# Patient Record
Sex: Female | Born: 2000 | Race: Black or African American | Hispanic: No | Smoking: Never smoker
Health system: Southern US, Community
[De-identification: ages and names within clinical notes are randomized; demographics above are authoritative.]

## PROBLEM LIST (undated history)

## (undated) DIAGNOSIS — Z87440 Personal history of urinary (tract) infections: Secondary | ICD-10-CM

## (undated) DIAGNOSIS — Z789 Other specified health status: Secondary | ICD-10-CM

## (undated) DIAGNOSIS — Z87898 Personal history of other specified conditions: Secondary | ICD-10-CM

## (undated) HISTORY — DX: Other specified health status: Z78.9

## (undated) HISTORY — DX: Personal history of other specified conditions: Z87.898

## (undated) HISTORY — DX: Personal history of urinary (tract) infections: Z87.440

## (undated) HISTORY — PX: OTHER SURGICAL HISTORY: SHX169

---
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
## 2018-08-10 LAB — OB RESULTS CONSOLE HIV ANTIBODY (ROUTINE TESTING): HIV: NONREACTIVE

## 2018-08-11 DIAGNOSIS — Z315 Encounter for genetic counseling: Secondary | ICD-10-CM | POA: Insufficient documentation

## 2018-08-11 LAB — SICKLE CELL SCREEN

## 2018-08-12 LAB — OB RESULTS CONSOLE GC/CHLAMYDIA
Chlamydia: NEGATIVE
Gonorrhea: NEGATIVE

## 2018-08-12 LAB — OB RESULTS CONSOLE ABO/RH
ABO/RH(D): O POS
RH Type: POSITIVE

## 2018-08-12 LAB — OB RESULTS CONSOLE ANTIBODY SCREEN: Antibody Screen: NEGATIVE

## 2018-08-12 LAB — OB RESULTS CONSOLE RPR: RPR: REACTIVE

## 2018-08-12 LAB — OB RESULTS CONSOLE HGB/HCT, BLOOD
HCT: 35 (ref 29–41)
Hemoglobin: 11.9

## 2018-08-12 LAB — OB RESULTS CONSOLE VARICELLA ZOSTER ANTIBODY, IGG
Varicella: IMMUNE
Varicella: IMMUNE

## 2018-08-12 LAB — OB RESULTS CONSOLE RUBELLA ANTIBODY, IGM: Rubella: IMMUNE

## 2018-08-12 LAB — OB RESULTS CONSOLE HEPATITIS B SURFACE ANTIGEN: Hepatitis B Surface Ag: NEGATIVE

## 2018-08-12 LAB — OB RESULTS CONSOLE PLATELET COUNT: Platelets: 283000

## 2018-09-06 DIAGNOSIS — O2341 Unspecified infection of urinary tract in pregnancy, first trimester: Secondary | ICD-10-CM | POA: Insufficient documentation

## 2018-09-06 DIAGNOSIS — Z6740 Type O blood, Rh positive: Secondary | ICD-10-CM | POA: Insufficient documentation

## 2018-09-06 DIAGNOSIS — Z3400 Encounter for supervision of normal first pregnancy, unspecified trimester: Secondary | ICD-10-CM | POA: Insufficient documentation

## 2018-09-07 ENCOUNTER — Other Ambulatory Visit: Payer: Self-pay

## 2018-09-07 ENCOUNTER — Ambulatory Visit: Payer: Medicaid Other | Admitting: Nurse Practitioner

## 2018-09-07 ENCOUNTER — Telehealth: Payer: Self-pay

## 2018-09-07 DIAGNOSIS — Z3402 Encounter for supervision of normal first pregnancy, second trimester: Secondary | ICD-10-CM

## 2018-09-07 NOTE — Telephone Encounter (Signed)
Attempted to call for [facility] Telehealth visit; no answer x 2, left voicemail message;per female @ home #, client at work; left appt # to call for rescheduled appt.Debera Lat, RN

## 2018-09-07 NOTE — Progress Notes (Signed)
   TELEPHONE OBSTETRICS VISIT ENCOUNTER NOTE  I connected with Jessica Love on 09/07/18 at  4:00 PM EDT by telephone at home and verified that I am speaking with the correct person using two identifiers.   I discussed the limitations, risks, security and privacy concerns of performing an evaluation and management service by telephone and the availability of in person appointments. I also discussed with the patient that there may be a patient responsible charge related to this service. The patient expressed understanding and agreed to proceed.  Subjective:  Jessica Love is a 18 y.o. G1P0 at [redacted]w[redacted]d being followed for ongoing prenatal care.  She is currently monitored for the following issues for this low-risk pregnancy and has Supervision of normal first pregnancy; Infection of urinary tract in pregnancy in first trimester; and Type O blood, Rh positive on their problem list.  Patient reports no complaints. Reports fetal movement. Denies any contractions, bleeding or leaking of fluid.   The following portions of the patient's history were reviewed and updated as appropriate: allergies, current medications, past family history, past medical history, past social history, past surgical history and problem list.   Objective:   General:  Alert, oriented and cooperative.   Mental Status: Normal mood and affect perceived. Normal judgment and thought content.  Rest of physical exam deferred due to type of encounter  Assessment and Plan:  Pregnancy: G1P0 at [redacted]w[redacted]d 1. Encounter for supervision of normal first pregnancy in second trimester Client admits to doing well Denies any additional questions or concerns at this time OB ROS flow sheet completed  Next [place] - 10/01/2018 Discussed change of EDD Client verbalizes understanding and is in agreement with plan of care   Preterm labor symptoms and general obstetric precautions including but not limited to vaginal bleeding, contractions, leaking of fluid  and fetal movement were reviewed in detail with the patient.  I discussed the assessment and treatment plan with the patient. The patient was provided an opportunity to ask questions and all were answered. The patient agreed with the plan and demonstrated an understanding of the instructions. The patient was advised to call back or seek an in-person office evaluation/go to the hospital for any urgent or concerning symptoms.  Please refer to After Visit Summary for other counseling recommendations.   I provided 5 minutes of non-face-to-face time during this encounter.  Return in about 4 weeks (around 10/05/2018) for routine prenatal care.  Future Appointments  Date Time Provider [place]  10/06/2018  4:00 PM AC-MH PROVIDER AC-MAT None    Berniece Andreas, NP

## 2018-09-07 NOTE — Progress Notes (Signed)
Call for [place] -confirmed pt. Identity-currently @ work but desired to continue with visit; reports EDC changed by [facility] per u/s-record updated; has f/u u/s appt. Scheduled; has completed antibiotic for UTI Debera Lat, RN

## 2018-10-06 ENCOUNTER — Ambulatory Visit: Payer: Medicaid Other | Admitting: Nurse Practitioner

## 2018-10-06 ENCOUNTER — Other Ambulatory Visit: Payer: Self-pay

## 2018-10-06 VITALS — BP 124/69 | Temp 98.1°F | Wt 181.0 lb

## 2018-10-06 DIAGNOSIS — Z3400 Encounter for supervision of normal first pregnancy, unspecified trimester: Secondary | ICD-10-CM

## 2018-10-06 DIAGNOSIS — O2341 Unspecified infection of urinary tract in pregnancy, first trimester: Secondary | ICD-10-CM

## 2018-10-06 NOTE — Progress Notes (Signed)
   PRENATAL VISIT NOTE  Subjective:  Jessica Love is a 18 y.o. G1P0 at [redacted]w[redacted]d being seen today for ongoing prenatal care.  She is currently monitored for the following issues for this low-risk pregnancy and has Supervision of normal first pregnancy; Infection of urinary tract in pregnancy in first trimester; Type O blood, Rh positive; and Encounter for procreative genetic counseling on their problem list.  Patient reports headache.   .  .   . Denies leaking of fluid/ROM.   The following portions of the patient's history were reviewed and updated as appropriate: allergies, current medications, past family history, past medical history, past social history, past surgical history and problem list. Problem list updated.  Objective:   Vitals:   10/06/18 1610  BP: 124/69  Temp: 98.1 F (36.7 C)  Weight: 181 lb (82.1 kg)    Fetal Status:           General:  Alert, oriented and cooperative. Patient is in no acute distress.  Skin: Skin is warm and dry. No rash noted.   Cardiovascular: Normal heart rate noted  Respiratory: Normal respiratory effort, no problems with respiration noted  Abdomen: Soft, gravid, appropriate for gestational age.        Pelvic: Cervical exam deferred        Extremities: Normal range of motion.     Mental Status: Normal mood and affect. Normal behavior. Normal judgment and thought content.   Assessment and Plan:  Pregnancy: G1P0 at [redacted]w[redacted]d  1. Supervision of normal first pregnancy, antepartum Client admits to doing well Reviewed OB questions Discussed Flexeril helping with headaches prn - client declines at this time.   2. Infection of urinary tract in pregnancy in first trimester Await lab results - Urine Culture  Client verbalizes understanding and is in agreement with plan of care   Preterm labor symptoms and general obstetric precautions including but not limited to vaginal bleeding, contractions, leaking of fluid and fetal movement were reviewed in  detail with the patient. Please refer to After Visit Summary for other counseling recommendations.  Return in about 4 weeks (around 11/03/2018) for telehealth, routine prenatal care.  Future Appointments  Date Time Provider [place]  11/03/2018  8:40 AM AC-MH PROVIDER AC-MAT None    Berniece Andreas, NP

## 2018-10-06 NOTE — Progress Notes (Signed)
In for visit; had [facility] u/s 10/02/18; discussed AFP only testing=>declines; needs C & S TOC Debera Lat, RN

## 2018-10-09 LAB — URINE CULTURE

## 2018-11-03 ENCOUNTER — Telehealth: Payer: Self-pay

## 2018-11-03 ENCOUNTER — Ambulatory Visit: Payer: Medicaid Other

## 2018-11-03 NOTE — Telephone Encounter (Signed)
Attempted to call for [facility] Telehealth scheduled visit; no answer, left voicemail message @ mobile#; left message with female @ home# Debera Lat, RN

## 2018-11-04 ENCOUNTER — Ambulatory Visit: Payer: Medicaid Other | Admitting: Physician Assistant

## 2018-11-04 ENCOUNTER — Other Ambulatory Visit: Payer: Self-pay

## 2018-11-04 DIAGNOSIS — D582 Other hemoglobinopathies: Secondary | ICD-10-CM | POA: Insufficient documentation

## 2018-11-04 DIAGNOSIS — Z3402 Encounter for supervision of normal first pregnancy, second trimester: Secondary | ICD-10-CM

## 2018-11-04 NOTE — Progress Notes (Signed)
TC to patient for maternity Telehealth visit. Patient states she is at work, but feels ok to talk at this time. Patient identity verified using two identifiers. S/S PTL reviewed with patient. Patient still unsure about pediatrician, needs list at next visit. Patient informed next visit for 28 week labs. Patient states she will be available for provider call for next 30 minutes.Jenetta Downer, RN

## 2018-11-04 NOTE — Progress Notes (Addendum)
   TELEPHONE OBSTETRICS VISIT ENCOUNTER NOTE  I connected with@ on 11/04/18 at 11:00 AM EDT by telephone at work at [employer] (she was on break) and verified that I am speaking with the correct person using two identifiers.   I discussed the limitations, risks, security and privacy concerns of performing an evaluation and management service by telephone and the availability of in person appointments. I also discussed with the patient that there may be a patient responsible charge related to this service. The patient expressed understanding and agreed to proceed.  Subjective:  Jessica Love is a 18 y.o. G1P0 at [redacted]w[redacted]d being followed for ongoing prenatal care.  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  Patient reports no complaints. Reports fetal movement. Denies any contractions, bleeding or leaking of fluid.   The following portions of the patient's history were reviewed and updated as appropriate: allergies, current medications, past family history, past medical history, past social history, past surgical history and problem list.   Objective:   General:  Alert, oriented and cooperative.   Mental Status: Normal mood and affect perceived. Normal judgment and thought content.  Rest of physical exam deferred due to type of encounter  Assessment and Plan:  Pregnancy: G1P0 at [redacted]w[redacted]d 1. Encounter for supervision of normal first pregnancy in second trimester Agrees to f/u in person for routine 28wk visit and labs in 3 wks. Enc to push fluids and elevate feet prn swelling (notes this with prolonged standing.)  2. 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 Discussed with Vertell Novak MD - Have pt come for hgb-only visit and refer to MFM for eval if pt has not already had MFM eval of abnl hgb.  Preterm labor symptoms and general obstetric precautions including but not limited to vaginal bleeding, contractions, leaking of fluid and fetal movement were reviewed in detail with the patient.  I discussed the assessment and treatment plan with the patient. The patient was provided an opportunity to ask questions and all were answered. The patient agreed with the plan and demonstrated an understanding of the instructions. The patient was advised to call back or seek an in-person office evaluation/go to the hospital for any urgent or concerning symptoms.  Please refer to After Visit Summary for other counseling recommendations.   I provided 5 minutes of non-face-to-face time during this encounter.  Return in about 3 weeks (around 11/25/2018) for Routine prenatal care, 28 wk labs.  Future Appointments  Date Time Provider [place]  11/29/2018  8:40 AM AC-MH PROVIDER AC-MAT None    Lora Havens, PA-C  Addendum: After chart review, no apparent MFM eval of abnormal hgb. Will have pt come for hgb (lab only visit) and then refer to MFM for eval. Referral form completed.

## 2018-11-09 ENCOUNTER — Other Ambulatory Visit: Payer: Medicaid Other

## 2018-11-09 ENCOUNTER — Other Ambulatory Visit: Payer: Self-pay

## 2018-11-09 DIAGNOSIS — Z3402 Encounter for supervision of normal first pregnancy, second trimester: Secondary | ICD-10-CM

## 2018-11-09 LAB — HEMOGLOBIN, FINGERSTICK: Hemoglobin: 11.2 g/dL (ref 11.1–15.9)

## 2018-11-09 NOTE — Progress Notes (Signed)
Hgb of 11.2 reviewed, no tx per standing order.

## 2018-11-09 NOTE — Addendum Note (Signed)
Addended by: Doy Mince on: 11/09/2018 11:48 AM   Modules accepted: Orders

## 2018-11-29 ENCOUNTER — Ambulatory Visit: Payer: Self-pay

## 2018-11-29 ENCOUNTER — Other Ambulatory Visit: Payer: Self-pay

## 2018-11-29 ENCOUNTER — Ambulatory Visit: Payer: Medicaid Other | Admitting: Family Medicine

## 2018-11-29 VITALS — BP 125/77 | Temp 97.1°F | Wt 198.6 lb

## 2018-11-29 DIAGNOSIS — Z23 Encounter for immunization: Secondary | ICD-10-CM

## 2018-11-29 DIAGNOSIS — D582 Other hemoglobinopathies: Secondary | ICD-10-CM

## 2018-11-29 DIAGNOSIS — O2341 Unspecified infection of urinary tract in pregnancy, first trimester: Secondary | ICD-10-CM

## 2018-11-29 DIAGNOSIS — Z3403 Encounter for supervision of normal first pregnancy, third trimester: Secondary | ICD-10-CM

## 2018-11-29 LAB — HEMOGLOBIN, FINGERSTICK: Hemoglobin: 11.5 g/dL (ref 11.1–15.9)

## 2018-11-29 LAB — HIV ANTIBODY (ROUTINE TESTING W REFLEX): HIV 1&2 Ab, 4th Generation: NONREACTIVE

## 2018-11-29 NOTE — Progress Notes (Addendum)
  PRENATAL VISIT NOTE  Subjective:  Jessica Love is a 18 y.o. G1P0 at [redacted]w[redacted]d being seen today for ongoing prenatal care.  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  Patient reports no complaints.  Contractions: Not present. Vag. Bleeding: None.  Movement: Present. Denies leaking of fluid.   The following portions of the patient's history were reviewed and updated as appropriate: allergies, current medications, past family history, past medical history, past social history, past surgical history and problem list.   Objective:   Vitals:   11/29/18 0840  BP: 125/77  Temp: (!) 97.1 F (36.2 C)  Weight: 198 lb 9.6 oz (90.1 kg)    Fetal Status: Fetal Heart Rate (bpm): 135 Fundal Height: 29 cm Movement: Present     General:  Alert, oriented and cooperative. Patient is in no acute distress.  Skin: Skin is warm and dry. No rash noted.   Cardiovascular: Normal heart rate noted  Respiratory: Normal respiratory effort, no problems with respiration noted  Abdomen: Soft, gravid, appropriate for gestational age.  Pain/Pressure: Absent     Pelvic: Cervical exam deferred        Extremities: Normal range of motion.  Edema: Trace  Mental Status: Normal mood and affect. Normal behavior. Normal judgment and thought content.   Assessment and Plan:  Pregnancy: G1P0 at [redacted]w[redacted]d 1. Encounter for supervision of normal first pregnancy in third trimester Here for routine labs Reviewed [facility] card and paging FM team in detail Rx for BP cuff sent to DME pharmacy Encouraged client to ask family/friends if they have a BP cuff she can borrow-- will need in person visits in the 3rd trimester unless BP cuff received.  - HIV [place]  [facility] - Glucose, 1 hour gestational - Tdap vaccine greater than or equal to 7yo IM - Urine Culture - Hemoglobin, venipuncture  2. Infection of urinary tract in pregnancy in first trimester TOC in August was negative   3. 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 Please refer to After Visit Summary for other counseling recommendations.   Return in about 2 weeks (around 12/13/2018) for Routine prenatal care, in person- patient may call to convert to telehealth if BP cuff available.  No future appointments.  Caren Macadam, MD

## 2018-11-29 NOTE — Progress Notes (Signed)
Negative responses to Covid-19 screening questions. Denies international travel for self or FOB since pregnant. 28 week labs today. Counseled regarding flu vaccine recommendation in pregnant - vaccine declined. Counseled regarding Tdap recommendation for people who will be around infant. Client tolerated Tdap vaccine today without complaint. Rich Number, RN  BP prescription faxed with confirmation received and sent for scanning. [facility] MFM consult referral faxed with confirmation received. Client aware [facility] scheduler will notify her via phone call of appt. Rich Number, RN  Hgb = 11.5 - no interventions needed per standing order. Rich Number, RN

## 2018-11-30 LAB — RPR: RPR Ser Ql: NONREACTIVE

## 2018-11-30 LAB — GLUCOSE, 1 HOUR GESTATIONAL: Gestational Diabetes Screen: 106 mg/dL (ref 65–139)

## 2018-12-01 ENCOUNTER — Telehealth: Payer: Self-pay | Admitting: General Practice

## 2018-12-01 LAB — URINE CULTURE

## 2018-12-13 ENCOUNTER — Ambulatory Visit: Payer: Medicaid Other

## 2018-12-15 ENCOUNTER — Other Ambulatory Visit: Payer: Self-pay

## 2018-12-15 ENCOUNTER — Ambulatory Visit: Payer: Medicaid Other | Admitting: Advanced Practice Midwife

## 2018-12-15 DIAGNOSIS — O2341 Unspecified infection of urinary tract in pregnancy, first trimester: Secondary | ICD-10-CM

## 2018-12-15 DIAGNOSIS — Z3403 Encounter for supervision of normal first pregnancy, third trimester: Secondary | ICD-10-CM

## 2018-12-15 DIAGNOSIS — D582 Other hemoglobinopathies: Secondary | ICD-10-CM

## 2018-12-15 LAB — URINALYSIS
Bilirubin, UA: NEGATIVE
Glucose, UA: NEGATIVE
Ketones, UA: NEGATIVE
Nitrite, UA: NEGATIVE
Protein,UA: NEGATIVE
RBC, UA: NEGATIVE
Specific Gravity, UA: 1.02 (ref 1.005–1.030)
Urobilinogen, Ur: 1 mg/dL (ref 0.2–1.0)
pH, UA: 7 (ref 5.0–7.5)

## 2018-12-15 NOTE — Progress Notes (Signed)
In for visit; denies hospital visits; taking PNV; undecided on Peds Debera Lat, RN

## 2018-12-15 NOTE — Progress Notes (Signed)
   PRENATAL VISIT NOTE  Subjective:  Jessica Love is a 18 y.o. G1P0 at [redacted]w[redacted]d being seen today for ongoing prenatal care.  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  Patient reports no complaints.  Contractions: Not present. Vag. Bleeding: None.  Movement: Present. Denies leaking of fluid/ROM.   The following portions of the patient's history were reviewed and updated as appropriate: allergies, current medications, past family history, past medical history, past social history, past surgical history and problem list. Problem list updated.  Objective:   Vitals:   12/15/18 0848  BP: 128/71  Temp: 97.8 F (36.6 C)  Weight: 199 lb 12.8 oz (90.6 kg)    Fetal Status: Fetal Heart Rate (bpm): 120 Fundal Height: 30 cm Movement: Present     General:  Alert, oriented and cooperative. Patient is in no acute distress.  Skin: Skin is warm and dry. No rash noted.   Cardiovascular: Normal heart rate noted  Respiratory: Normal respiratory effort, no problems with respiration noted  Abdomen: Soft, gravid, appropriate for gestational age.  Pain/Pressure: Absent     Pelvic: Cervical exam deferred        Extremities: Normal range of motion.  Edema: Trace  Mental Status: Normal mood and affect. Normal behavior. Normal judgment and thought content.   Assessment and Plan:  Pregnancy: G1P0 at [redacted]w[redacted]d  1. 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  BP increasing. U/A today.   Denies h/a.   Had baby shower last weekend.  2. 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 Infection of urinary tract in pregnancy  in first trimester TOC neg 11/29/18   Preterm labor symptoms and general obstetric precautions including but not limited to vaginal bleeding, contractions, leaking of fluid and fetal movement were reviewed in detail with the patient. Please refer to After Visit Summary for other counseling recommendations.  No follow-ups on file.  No future appointments.  Herbie Saxon, CNM

## 2018-12-31 ENCOUNTER — Ambulatory Visit: Payer: Medicaid Other | Admitting: Advanced Practice Midwife

## 2018-12-31 ENCOUNTER — Other Ambulatory Visit: Payer: Self-pay

## 2018-12-31 VITALS — BP 124/70 | Temp 97.7°F | Wt 199.4 lb

## 2018-12-31 DIAGNOSIS — Z3403 Encounter for supervision of normal first pregnancy, third trimester: Secondary | ICD-10-CM

## 2018-12-31 DIAGNOSIS — D582 Other hemoglobinopathies: Secondary | ICD-10-CM

## 2018-12-31 NOTE — Progress Notes (Signed)
   PRENATAL VISIT NOTE  Subjective:  Jessica Love is a 18 y.o. G1P0 at [redacted]w[redacted]d being seen today for ongoing prenatal care.  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  Patient reports no complaints.  Contractions: Not present. Vag. Bleeding: None.  Movement: Present.  Denies leaking of fluid/ROM.   The following portions of the patient's history were reviewed and updated as appropriate: allergies, current medications, past family history, past medical history, past social history, past surgical history and problem list. Problem list updated.  Objective:   Vitals:   12/31/18 0824  BP: 124/70  Temp: 97.7 F (36.5 C)  Weight: 199 lb 6.4 oz (90.4 kg)    Fetal Status: Fetal Heart Rate (bpm): 140 Fundal Height: 33 cm Movement: Present     General:  Alert, oriented and cooperative. Patient is in no acute distress.  Skin: Skin is warm and dry. No rash noted.   Cardiovascular: Normal heart rate noted  Respiratory: Normal respiratory effort, no problems with respiration noted  Abdomen: Soft, gravid, appropriate for gestational age.  Pain/Pressure: Absent     Pelvic: Cervical exam deferred        Extremities: Normal range of motion.  Edema: None  Mental Status: Normal mood and affect. Normal behavior. Normal judgment and thought content.   Assessment and Plan:  Pregnancy: G1P0 at [redacted]w[redacted]d  1. Encounter for supervision of normal first pregnancy in third trimester Still working 40 hrs/wk.  Feels well but tired.  Ready for baby at home.  [place] for [employer].     2. 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 Please refer to After Visit Summary for other counseling recommendations.  No follow-ups on file.  No future appointments.  Herbie Saxon, CNM

## 2018-12-31 NOTE — Progress Notes (Signed)
In for visit; taking PNV; denies hospital visits; aware of 01/05/19 [facility] Genetic appt; undecided on Peds Debera Lat, [employer]

## 2019-01-12 ENCOUNTER — Other Ambulatory Visit: Payer: Self-pay

## 2019-01-12 ENCOUNTER — Ambulatory Visit: Payer: Medicaid Other | Admitting: Advanced Practice Midwife

## 2019-01-12 DIAGNOSIS — D582 Other hemoglobinopathies: Secondary | ICD-10-CM

## 2019-01-12 DIAGNOSIS — Z3403 Encounter for supervision of normal first pregnancy, third trimester: Secondary | ICD-10-CM

## 2019-01-12 NOTE — Progress Notes (Signed)
Here today for 35.1 week MH RV. Taking PNV QD as well as TUMS for heartburn. Denies ED/hospital visits since last RV. CCNC and PHQ9 forms today. Hal Morales, RN

## 2019-01-12 NOTE — Progress Notes (Signed)
   PRENATAL VISIT NOTE  Subjective:  Jessica Love is a 18 y.o. G1P0 at [redacted]w[redacted]d being seen today for ongoing prenatal care.  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  Patient reports no complaints.  Contractions: Not present. Vag. Bleeding: None.  Movement: Present. Denies leaking of fluid/ROM.   The following portions of the patient's history were reviewed and updated as appropriate: allergies, current medications, past family history, past medical history, past social history, past surgical history and problem list. Problem list updated.  Objective:   Vitals:   01/12/19 1510  BP: 123/68  Temp: (!) 97.3 F (36.3 C)  Weight: 207 lb 6.4 oz (94.1 kg)    Fetal Status: Fetal Heart Rate (bpm): 140 Fundal Height: 34 cm Movement: Present     General:  Alert, oriented and cooperative. Patient is in no acute distress.  Skin: Skin is warm and dry. No rash noted.   Cardiovascular: Normal heart rate noted  Respiratory: Normal respiratory effort, no problems with respiration noted  Abdomen: Soft, gravid, appropriate for gestational age.  Pain/Pressure: Absent     Pelvic: Cervical exam deferred        Extremities: Normal range of motion.  Edema: None  Mental Status: Normal mood and affect. Normal behavior. Normal judgment and thought content.   Assessment and Plan:  Pregnancy: G1P0 at [redacted]w[redacted]d  1. Encounter for supervision of normal first pregnancy in third trimester Working 40 hrs/wk.  Has car seat.  PHq-9=7.  Denies HI/SI and confides in her mom.  Declines [place] counseling  2. 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   Preterm labor symptoms and general obstetric precautions including but not limited to vaginal bleeding, contractions, leaking of fluid and fetal movement were reviewed in detail with the patient. Please refer to After Visit Summary for other counseling recommendations.  No follow-ups on file.  Future Appointments  Date Time Provider [place]  01/19/2019  8:20 AM AC-MH PROVIDER AC-MAT None    Herbie Saxon, CNM

## 2019-01-19 ENCOUNTER — Ambulatory Visit: Payer: Medicaid Other | Admitting: Family Medicine

## 2019-01-19 ENCOUNTER — Encounter: Payer: Self-pay | Admitting: Family Medicine

## 2019-01-19 ENCOUNTER — Other Ambulatory Visit: Payer: Self-pay

## 2019-01-19 DIAGNOSIS — Z3403 Encounter for supervision of normal first pregnancy, third trimester: Secondary | ICD-10-CM

## 2019-01-19 LAB — URINALYSIS
Bilirubin, UA: NEGATIVE
Glucose, UA: NEGATIVE
Ketones, UA: NEGATIVE
Nitrite, UA: NEGATIVE
Protein,UA: NEGATIVE
RBC, UA: NEGATIVE
Specific Gravity, UA: 1.02 (ref 1.005–1.030)
Urobilinogen, Ur: 1 mg/dL (ref 0.2–1.0)
pH, UA: 7 (ref 5.0–7.5)

## 2019-01-19 NOTE — Progress Notes (Addendum)
Here today for 36.1 week MH RV. Taking PNV QD and Tums as needed. Denies ED/hospital visits since last RV. 36 week packet and labs today. CCNC, [place] 9 forms today. Declines self collection of labs as well as Flu vaccine. Hal Morales, RN

## 2019-01-19 NOTE — Progress Notes (Signed)
   PRENATAL VISIT NOTE  Subjective:  Jessica Love is a 18 y.o. G1P0 at [redacted]w[redacted]d being seen today for ongoing prenatal care.  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  Patient reports no complaints.  Contractions: Not present. Vag. Bleeding: None.  Movement: Present. Denies leaking of fluid/ROM.   Phq-9 score today is 5 d/t sleep difficulties, feeling tired. 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   Bp somewhat elevated today. Denies vision changes, epigastric pain, edema, n/v.   The following portions of the patient's history were reviewed and updated as appropriate: allergies, current medications, past family history, past medical history, past social history, past surgical history and problem list. Problem list updated.  Objective:   Vitals:   01/19/19 0826  BP: 134/76  Temp: (!) 97.4 F (36.3 C)  Weight: 209 lb 9.6 oz (95.1 kg)    Fetal Status: Fetal Heart Rate (bpm): 145 Fundal Height: 36 cm Movement: Present  Presentation: Vertex  General:  Alert, oriented and cooperative. Patient is in no acute distress.  Skin: Skin is warm and dry. No rash noted.   Cardiovascular: Normal heart rate noted  Respiratory: Normal respiratory effort, no problems with respiration noted  Abdomen: Soft, gravid, appropriate for gestational age.  Pain/Pressure: Absent     Pelvic: Cervical exam performed     Cervix normal, +white discharge in vaginal vault  Extremities: Normal range of motion.  Edema: None  Mental Status: Normal mood and affect. Normal behavior. Normal judgment and thought content.   Assessment and Plan:  Pregnancy: G1P0 at [redacted]w[redacted]d   1. Encounter for supervision of normal first pregnancy in third  trimester -BP 134/76, higher than previous, urine dip without protein today and no preeclampsia s/s. Discussed warning signs, when to go to ER if needed. Follow closely at visit next week. -Otherwise up to date, pt is ready for baby.  - GBS Culture - Chlamydia/GC NAA, Confirmation - Urinalysis (Urine Dip)    Term labor symptoms and general obstetric precautions including but not limited to vaginal bleeding, contractions, leaking of fluid and fetal movement were reviewed in detail with the patient. Please refer to After Visit Summary for other counseling recommendations.  Return in about 1 week (around 01/26/2019) for routine prenatal care.  Future Appointments  Date Time Provider [place]  01/26/2019  8:40 AM AC-MH PROVIDER AC-[facility] None    Kandee Keen, PA-C

## 2019-01-21 LAB — CHLAMYDIA/GC NAA, CONFIRMATION
Chlamydia trachomatis, NAA: NEGATIVE
Neisseria gonorrhoeae, NAA: NEGATIVE

## 2019-01-23 LAB — CULTURE, BETA STREP (GROUP B ONLY): Strep Gp B Culture: NEGATIVE

## 2019-01-26 ENCOUNTER — Ambulatory Visit: Payer: Medicaid Other | Admitting: Advanced Practice Midwife

## 2019-01-26 ENCOUNTER — Other Ambulatory Visit: Payer: Self-pay

## 2019-01-26 DIAGNOSIS — Z3403 Encounter for supervision of normal first pregnancy, third trimester: Secondary | ICD-10-CM

## 2019-01-26 NOTE — Progress Notes (Signed)
Here today for 37.1 week MH RV. Taking PNV QD, denies ED/hospital visit since last RV. Hal Morales, RN

## 2019-01-26 NOTE — Progress Notes (Signed)
   PRENATAL VISIT NOTE  Subjective:  Jessica Love is a 18 y.o. G1P0 at [redacted]w[redacted]d being seen today for ongoing prenatal care.  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  Patient reports no complaints.  Contractions: Not present. Vag. Bleeding: None.  Movement: Present. Denies leaking of fluid/ROM.   The following portions of the patient's history were reviewed and updated as appropriate: allergies, current medications, past family history, past medical history, past social history, past surgical history and problem list. Problem list updated.  Objective:   Vitals:   01/26/19 0856  BP: 125/79  Temp: 97.8 F (36.6 C)  Weight: 209 lb 6.4 oz (95 kg)    Fetal Status: Fetal Heart Rate (bpm): 150 Fundal Height: 36 cm Movement: Present  Presentation: Vertex  General:  Alert, oriented and cooperative. Patient is in no acute distress.  Skin: Skin is warm and dry. No rash noted.   Cardiovascular: Normal heart rate noted  Respiratory: Normal respiratory effort, no problems with respiration noted  Abdomen: Soft, gravid, appropriate for gestational age.  Pain/Pressure: Absent     Pelvic: Cervical exam deferred        Extremities: Normal range of motion.  Edema: None  Mental Status: Normal mood and affect. Normal behavior. Normal judgment and thought content.   Assessment and Plan:  Pregnancy: G1P0 at [redacted]w[redacted]d  1. Encounter for supervision of normal first pregnancy in third trimester Ready for baby at home.  Has car seat.  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  FOB to be with her in labor.   Preterm labor symptoms and general obstetric precautions including but not limited to vaginal bleeding, contractions, leaking of fluid  and fetal movement were reviewed in detail with the patient. Please refer to After Visit Summary for other counseling recommendations.  No follow-ups on file.  Future Appointments  Date Time Provider [place]  02/02/2019  8:40 AM [facility] PROVIDER AC-[facility] None    Herbie Saxon, CNM

## 2019-02-02 ENCOUNTER — Other Ambulatory Visit: Payer: Self-pay

## 2019-02-02 ENCOUNTER — Ambulatory Visit: Payer: Medicaid Other | Admitting: Advanced Practice Midwife

## 2019-02-02 DIAGNOSIS — Z3403 Encounter for supervision of normal first pregnancy, third trimester: Secondary | ICD-10-CM

## 2019-02-02 NOTE — Progress Notes (Signed)
   PRENATAL VISIT NOTE  Subjective:  Jessica Love is a 18 y.o. G1P0 at [redacted]w[redacted]d being seen today for ongoing prenatal care.  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  Patient reports no complaints.  Contractions: Not present. Vag. Bleeding: None.  Movement: Present. Denies leaking of fluid/ROM.   The following portions of the patient's history were reviewed and updated as appropriate: allergies, current medications, past family history, past medical history, past social history, past surgical history and problem list. Problem list updated.  Objective:   Vitals:   02/02/19 0833  BP: 128/75  Pulse: (!) 118  Temp: 98.5 F (36.9 C)  Weight: 211 lb 6.4 oz (95.9 kg)    Fetal Status: Fetal Heart Rate (bpm): 130 Fundal Height: 37 cm Movement: Present  Presentation: Vertex  General:  Alert, oriented and cooperative. Patient is in no acute distress.  Skin: Skin is warm and dry. No rash noted.   Cardiovascular: Normal heart rate noted  Respiratory: Normal respiratory effort, no problems with respiration noted  Abdomen: Soft, gravid, appropriate for gestational age.  Pain/Pressure: Absent     Pelvic: Cervical exam deferred        Extremities: Normal range of motion.  Edema: Trace  Mental Status: Normal mood and affect. Normal behavior. Normal judgment and thought content.   Assessment and Plan:  Pregnancy: G1P0 at [redacted]w[redacted]d  1. Encounter for supervision of normal first pregnancy in third trimester Feels well.  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  Ready for baby at home   Term labor symptoms and general obstetric precautions including but not limited to vaginal bleeding, contractions,  leaking of fluid and fetal movement were reviewed in detail with the patient. Please refer to After Visit Summary for other counseling recommendations.  No follow-ups on file.  No future appointments.  Herbie Saxon, CNM

## 2019-02-02 NOTE — Progress Notes (Signed)
In for visit; denies hospital visits since last appt Anglea Gordner, RN  

## 2019-02-09 ENCOUNTER — Ambulatory Visit: Payer: Medicaid Other | Admitting: Family Medicine

## 2019-02-09 ENCOUNTER — Other Ambulatory Visit: Payer: Self-pay

## 2019-02-09 ENCOUNTER — Encounter: Payer: Self-pay | Admitting: Family Medicine

## 2019-02-09 DIAGNOSIS — Z3403 Encounter for supervision of normal first pregnancy, third trimester: Secondary | ICD-10-CM

## 2019-02-09 NOTE — Progress Notes (Signed)
Here today for 39.1 week MH RV. Taking PNV every day, denies ED/hospital visits since last RV. Needs Cervical check and [facility] IOL form today. Hal Morales, RN

## 2019-02-09 NOTE — Progress Notes (Signed)
   PRENATAL VISIT NOTE  Subjective:  Jessica Love is a 18 y.o. G1P0 at [redacted]w[redacted]d being seen today for ongoing prenatal care.  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  Patient reports no complaints.  Contractions: Not present. Vag. Bleeding: None.  Movement: Present. Denies leaking of fluid/ROM.   The following portions of the patient's history were reviewed and updated as appropriate: allergies, current medications, past family history, past medical history, past social history, past surgical history and problem list. Problem list updated.  Objective:   Vitals:   02/09/19 1459  BP: 119/72  Pulse: 91  Temp: 97.8 F (36.6 C)  Weight: 213 lb 6.4 oz (96.8 kg)    Fetal Status: Fetal Heart Rate (bpm): 140 Fundal Height: 38 cm Movement: Present  Presentation: Vertex  General:  Alert, oriented and cooperative. Patient is in no acute distress.  Skin: Skin is warm and dry. No rash noted.   Cardiovascular: Normal heart rate noted  Respiratory: Normal respiratory effort, no problems with respiration noted  Abdomen: Soft, gravid, appropriate for gestational age.  Pain/Pressure: Absent     Pelvic: Cervical exam performed Dilation: 1 Effacement (%): 50    Extremities: Normal range of motion.  Edema: Trace  Mental Status: Normal mood and affect. Normal behavior. Normal judgment and thought content.   Assessment and Plan:  Pregnancy: G1P0 at [redacted]w[redacted]d  1. Encounter for supervision of normal first pregnancy in third trimester IOL form for [facility] completed today. Ready for baby at home, knows when to go to L&D. Last day of work was today.   Term labor symptoms and general obstetric precautions including but not  limited to vaginal bleeding, contractions, leaking of fluid and fetal movement were reviewed in detail with the patient. Please refer to After Visit Summary for other counseling recommendations.  Return in about 1 week (around 02/16/2019) for routine prenatal care.  Future Appointments  Date Time Provider [place]  02/16/2019  3:20 PM AC-MH PROVIDER AC-MAT None    Kandee Keen, PA-C

## 2019-02-16 ENCOUNTER — Ambulatory Visit: Payer: Medicaid Other | Admitting: Family Medicine

## 2019-02-16 ENCOUNTER — Other Ambulatory Visit: Payer: Self-pay

## 2019-02-16 ENCOUNTER — Encounter: Payer: Self-pay | Admitting: Family Medicine

## 2019-02-16 DIAGNOSIS — Z3403 Encounter for supervision of normal first pregnancy, third trimester: Secondary | ICD-10-CM

## 2019-02-16 LAB — URINALYSIS
Bilirubin, UA: NEGATIVE
Glucose, UA: NEGATIVE
Ketones, UA: NEGATIVE
Nitrite, UA: NEGATIVE
Protein,UA: NEGATIVE
Specific Gravity, UA: 1.015 (ref 1.005–1.030)
Urobilinogen, Ur: 1 mg/dL (ref 0.2–1.0)
pH, UA: 7.5 (ref 5.0–7.5)

## 2019-02-16 NOTE — Progress Notes (Signed)
   PRENATAL VISIT NOTE  Subjective:  Jessica Love is a 18 y.o. G1P0 at [redacted]w[redacted]d being seen today for ongoing prenatal care.  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  Patient reports no complaints.  Contractions: Not present. Vag. Bleeding: None.  Movement: Present. Denies leaking of fluid/ROM.   States she may have had small contractions, nothing regular or painful. Feeling well today.  The following portions of the patient's history were reviewed and updated as appropriate: allergies, current medications, past family history, past medical history, past social history, past surgical history and problem list. Problem list updated.  Objective:   Vitals:   02/16/19 1526  BP: 121/75  Pulse: 100  Temp: 97.8 F (36.6 C)  Weight: 219 lb 12.8 oz (99.7 kg)    Fetal Status: Fetal Heart Rate (bpm): 150 Fundal Height: 40 cm Movement: Present  Presentation: Vertex  General:  Alert, oriented and cooperative. Patient is in no acute distress.  Skin: Skin is warm and dry. No rash noted.   Cardiovascular: Normal heart rate noted  Respiratory: Normal respiratory effort, no problems with respiration noted  Abdomen: Soft, gravid, appropriate for gestational age.  Pain/Pressure: Absent     Pelvic: Cervical exam performed Dilation: 3 Effacement (%): 90    Extremities: Normal range of motion.  Edema: Trace  Mental Status: Normal mood and affect. Normal behavior. Normal judgment and thought content.   Assessment and Plan:  Pregnancy: G1P0 at [redacted]w[redacted]d  1. Encounter for supervision of normal first pregnancy in third trimester -Pt knows when to go to L&D and is ready for baby at home. RN to confirm with [facility] today that  IOL is scheduled for Jan 5. -Offered to sweep membranes today, discussed risks and benefits. Pt stated understanding and decided to proceed, membranes swept today.  -She has gained 6 lbs in past week, states swelling of hands and nose is somewhat worse. Has had 2 episodes of HA, resolved on own, no medication needed. No abd pain, n/v or vision changes. BP wnl, urine today without protein. Preeclampsia s/s discussed, advised to go to ER if present.    Term labor symptoms and general obstetric precautions including but not limited to vaginal bleeding, contractions, leaking of fluid and fetal movement were reviewed in detail with the patient. Please refer to After Visit Summary for other counseling recommendations.  No follow-ups on file.  No future appointments.  Kandee Keen, PA-C

## 2019-02-16 NOTE — Progress Notes (Signed)
Pt denies visits to ER since her last visit to [facility]. Taking PNV.

## 2019-02-16 NOTE — Progress Notes (Signed)
[facility] IOL referral form faxed x3 to [phone]. Voicemail left for [facility] Scheduling) to return call for needed IOL date. Hal Morales, RN

## 2019-02-17 ENCOUNTER — Telehealth: Payer: Self-pay

## 2019-02-17 NOTE — Telephone Encounter (Signed)
Received call from [facility] procedure scheduler) with IOL appt of 02/22/2019. [facility] will call client between 7 and 11 am with arrival time. Call to client with above information and [facility] with number to call provided. Call to home number and left message with female requesting assistance contacting client to call [place]. Client immediately returned call and sppy nformation provided with client verbalizing understanding. Rich Number, RN

## 2019-02-18 DIAGNOSIS — Z3403 Encounter for supervision of normal first pregnancy, third trimester: Secondary | ICD-10-CM

## 2019-04-07 ENCOUNTER — Ambulatory Visit: Payer: Self-pay

## 2019-04-07 ENCOUNTER — Ambulatory Visit: Payer: Medicaid Other

## 2019-04-19 ENCOUNTER — Ambulatory Visit: Payer: Medicaid Other | Admitting: Advanced Practice Midwife

## 2019-04-19 ENCOUNTER — Other Ambulatory Visit: Payer: Self-pay

## 2019-04-19 ENCOUNTER — Encounter: Payer: Self-pay | Admitting: Family Medicine

## 2019-04-19 VITALS — BP 124/78 | Ht 67.0 in | Wt 191.8 lb

## 2019-04-19 DIAGNOSIS — Z3009 Encounter for other general counseling and advice on contraception: Secondary | ICD-10-CM

## 2019-04-19 DIAGNOSIS — Z3042 Encounter for surveillance of injectable contraceptive: Secondary | ICD-10-CM

## 2019-04-19 NOTE — Progress Notes (Signed)
Post Partum Exam  Jessica Love is a 19 y.o. G1P1 nonsmoker SBF female who presents for a postpartum visit.  SROM with SVD 02/18/19 at 40 3/7 wks M 8#2 with epidural and no vaginal lacerations, Apgars 6/8.  FOB and his mom help her with the baby.  Breastfeeding 2-3x/night and formula q 2 hrs during day (4 oz).  No more lochia.  LMP 04/01/19.  Denies ETOH, cigs pp.  Working 40 hrs/wk since 04/01/19.  She is 8 weeks postpartum following a spontaneous vaginal delivery. I have fully reviewed the prenatal and intrapartum course. The delivery was at 40.3 gestational weeks.  Anesthesia: epidural. Postpartum course has been wnl. Baby's course has been wnl. Baby is feeding by breast and bottlefeeding Bleeding no bleeding. Bowel function is normal. Bladder function is normal. Patient is sexually active. Contraception method is Depo-Provera injections.   Postpartum depression screening: Edinburgh Postnatal Depression Scale - 04/19/19 1012      Edinburgh Postnatal Depression Scale:  In the Past 7 Days   I have been able to laugh and see the funny side of things.  0    I have looked forward with enjoyment to things.  0    I have blamed myself unnecessarily when things went wrong.  0    I have been anxious or worried for no good reason.  1    I have felt scared or panicky for no good reason.  0    Things have been getting on top of me.  0    I have been so unhappy that I have had difficulty sleeping.  0    I have felt sad or miserable.  0    I have been so unhappy that I have been crying.  0    The thought of harming myself has occurred to me.  0    Edinburgh Postnatal Depression Scale Total  1        Last pap smear not done as 19 yo   Review of Systems Pertinent items noted in HPI and remainder of comprehensive ROS otherwise negative.    Objective:  BP 124/78   Ht 5\' 7"  (1.702 m)   Wt 191 lb 12.8 oz (87 kg)   LMP 04/01/2019 (Exact Date)   Breastfeeding Yes   BMI 30.04 kg/m   Gen: well appearing,  NAD HEENT: no scleral icterus CV: RR Lung: Normal WOB; cl to auscultation, -CVAT Heart:  NSRR Breast:performed-not indicated denies bleeding, sore nipples, lumps, pain Ext: warm well perfused Abdomen: +striae, soft without tenderness, fair tone GU: normal external genitalia; vagina pink and moist with cl leukorrhea, uterus NSSC, -CVAT Rectal: performed -  not indicated       Assessment:    8 week postpartum exam. Pap smear to be done at age 45   Plan:   1. Contraception: Contraception counseling: Reviewed all forms of birth control options in the tiered based approach. available including abstinence; over the counter/barrier methods; hormonal contraceptive medication including pill, patch, ring, injection,contraceptive implant; hormonal and nonhormonal IUDs; permanent sterilization options including vasectomy and the various tubal sterilization modalities. Risks, benefits, and typical effectiveness rates were reviewed.  Questions were answered.  Written information was also given to the patient to review.  Patient desires DMPA for birth control but wants to obtain from [facility].  She will follow up in 11 weeks from 03/22/19 for surveillance.  She was told to call with any further questions, or with any concerns about this method of contraception.  Emphasized use of condoms 100% of the time for STI prevention.  Patient was offered ECP. ECP was not accepted by the patient. ECP counseling was not given - see RN documentation Recommended delay in pregnancy for 18 months   2. Infant feeding:  patient is currently feeding with breastfeeding at night 2-3x/night and formula q 2 hours during day (4 oz).   -Recommended patient engage with WIC/BFpeer counselors  -Counseled to sign new child up for [facility] services 3. Mood: EPDS is low risk. Reviewed resources and that mood sx in first year after pregnancy are considered related to pregnancy and to reach out for help at [facility] if needed. Discussed [facility] as link to  care and availability of LCSW for counseling  4. Chronic Medical Conditions:   list chronic medical problems and follow up/management plan--none.   1. Family planning Pt desires DMPA q 11-13 weeks at [facility]  2. Postpartum exam wnl  3. Encounter for surveillance of injectable contraceptive Received DMPA 03/22/19 at [facility]   Patient given handout about PCP care in the community Given MVI per family planning program guidelines and availability  Follow up in: 11 weeks or as needed.

## 2019-04-19 NOTE — Progress Notes (Signed)
Patient declines condoms and MVI's. Continues taking remainder of PNV's while breastfeeding. Patient opts to receive future Depo Injections at [facility]. Tawny Hopping, RN

## 2019-04-19 NOTE — Progress Notes (Signed)
Here today for PP Exam. NSVD of female child 02/18/2019 @ [facility]. Patient was given Depo 03/22/2019 @ [facility] states did not have a PP exam at that visit.

## 2019-06-07 ENCOUNTER — Encounter: Payer: Self-pay | Admitting: Emergency Medicine

## 2019-06-07 ENCOUNTER — Other Ambulatory Visit: Payer: Self-pay

## 2019-06-07 DIAGNOSIS — M7918 Myalgia, other site: Secondary | ICD-10-CM | POA: Insufficient documentation

## 2019-06-07 DIAGNOSIS — Z20822 Contact with and (suspected) exposure to covid-19: Secondary | ICD-10-CM | POA: Insufficient documentation

## 2019-06-07 DIAGNOSIS — J069 Acute upper respiratory infection, unspecified: Secondary | ICD-10-CM | POA: Insufficient documentation

## 2019-06-07 DIAGNOSIS — Z79899 Other long term (current) drug therapy: Secondary | ICD-10-CM | POA: Diagnosis not present

## 2019-06-07 DIAGNOSIS — R509 Fever, unspecified: Secondary | ICD-10-CM | POA: Diagnosis not present

## 2019-06-07 DIAGNOSIS — R05 Cough: Secondary | ICD-10-CM | POA: Insufficient documentation

## 2019-06-07 DIAGNOSIS — J029 Acute pharyngitis, unspecified: Secondary | ICD-10-CM | POA: Diagnosis present

## 2019-06-07 MED ORDER — ACETAMINOPHEN 325 MG PO TABS
650.0000 mg | ORAL_TABLET | Freq: Once | ORAL | Status: AC | PRN
  Administered 2019-06-07: 650 mg via ORAL
  Filled 2019-06-07: qty 2

## 2019-06-07 NOTE — ED Triage Notes (Signed)
Pt presents to ED with sore throat since monday and congestion with fever today. +fatigue

## 2019-06-08 ENCOUNTER — Emergency Department
Admission: EM | Admit: 2019-06-08 | Discharge: 2019-06-08 | Disposition: A | Discharge status: Home / Self Care | Payer: Medicaid Other | Attending: Emergency Medicine | Admitting: Emergency Medicine

## 2019-06-08 ENCOUNTER — Emergency Department: Payer: Medicaid Other

## 2019-06-08 DIAGNOSIS — J069 Acute upper respiratory infection, unspecified: Secondary | ICD-10-CM

## 2019-06-08 LAB — RESPIRATORY PANEL BY RT PCR (FLU A&B, COVID)
Influenza A by PCR: NEGATIVE
Influenza B by PCR: NEGATIVE
SARS Coronavirus 2 by RT PCR: NEGATIVE

## 2019-06-08 LAB — GROUP A STREP BY PCR: Group A Strep by PCR: NOT DETECTED

## 2019-06-08 NOTE — Telephone Encounter (Signed)
Encounter opened in error

## 2019-06-08 NOTE — ED Provider Notes (Signed)
[facility] Emergency Department Provider Note  ____________________________________________  Time seen: Approximately 5:19 AM  I have reviewed the triage vital signs and the nursing notes.   HISTORY  Chief Complaint Sore Throat   HPI Jessica Love is a 19 y.o. female who Presents for evaluation of sore throat fever and cough.  Patient reports 2 days of sore throat, congestion, fatigue, body aches, chills, and dry cough.  No chest pain or shortness of breath, no abdominal pain, no vomiting or diarrhea.  No known Covid exposures.  Her symptoms have been moderate and constant for the last 2 days.  Fever started today which prompted her visit to the emergency room.  Past Medical History:  Diagnosis Date  . History of alcohol use   . History of urinary tract infection     Patient Active Problem List   Diagnosis Date Noted  . 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 Supervision of normal first pregnancy 09/06/2018  . Infection of urinary tract in pregnancy in first trimester 09/06/2018  . Type O blood, Rh positive 09/06/2018  . Encounter for procreative genetic counseling 08/11/2018    History reviewed. No pertinent surgical history.  Prior to Admission medications   Medication Sig Start Date End Date Taking? Authorizing Provider  calcium carbonate (TUMS - DOSED IN MG ELEMENTAL CALCIUM) 500 MG chewable tablet Chew 1 tablet by mouth daily.    [provider]  prenatal vitamin w/FE, FA (NATACHEW) 29-1 MG CHEW chewable tablet Chew 1 tablet by mouth daily at 12 noon.    [provider]    Allergies Patient has no known allergies.  Family History  Problem Relation Age of Onset  . Hypertension Maternal Grandmother   . Diabetes Maternal Grandmother   . Cancer Maternal Grandmother   . Depression Maternal Grandfather   . Hypertension Maternal Grandfather   . Diabetes Maternal Grandfather   . Heart attack Maternal  Grandfather   . Cirrhosis Paternal Grandfather   . Renal Disease Paternal Grandfather     Social History Social History   Tobacco Use  . Smoking status: Never Smoker  . Smokeless tobacco: Never Used  Substance Use Topics  . Alcohol use: Not Currently  . Drug use: Not Currently    Review of Systems  Constitutional: + fever, fatigue Eyes: Negative for visual changes. ENT: + sore throat. Neck: No neck pain  Cardiovascular: Negative for chest pain. Respiratory: Negative for shortness of breath. + cough Gastrointestinal: Negative for abdominal pain, vomiting or diarrhea. Genitourinary: Negative for dysuria. Musculoskeletal: Negative for back pain. Skin: Negative for rash. Neurological: Negative for headaches, weakness or numbness. Psych: No SI or HI  ____________________________________________   PHYSICAL EXAM:  VITAL SIGNS: ED Triage Vitals  Enc Vitals Group     BP 06/07/19 2345 122/71     Pulse Rate 06/07/19 2345 (!) 134     Resp 06/07/19 2345 20     Temp 06/07/19 2345 (!) 101.4 F (38.6 C)     Temp Source 06/07/19 2345 Oral     SpO2 06/07/19 2345 99 %     Weight 06/07/19 2346 181 lb (82.1 kg)     Height 06/07/19 2346 5\' 7"  (1.702 m)     Head Circumference --      Peak Flow --      Pain Score 06/07/19 2345 0     Pain Loc --      Pain Edu? --      Excl.  in [place]? --     Constitutional: Alert and oriented. Well appearing and in no apparent distress. HEENT:      Head: Normocephalic and atraumatic.         Eyes: Conjunctivae are normal. Sclera is non-icteric.       Mouth/Throat: Mucous membranes are moist.  Oropharynx is clear with no tonsillar exudate, swelling or erythema, no peritonsillar abscess.      Neck: Supple with no signs of meningismus. Cardiovascular: Tachycardic with regular rhythm. No murmurs, gallops, or rubs. 2+ symmetrical distal pulses are present in all extremities.  Respiratory: Normal respiratory effort. Lungs are clear to auscultation  bilaterally. No wheezes, crackles, or rhonchi.  Gastrointestinal: Soft, non tender, and non distended with positive bowel sounds.  Musculoskeletal: No edema, cyanosis, or erythema of extremities. Neurologic: Normal speech and language. Face is symmetric. Moving all extremities. No gross focal neurologic deficits are appreciated. Skin: Skin is warm, dry and intact. No rash noted. Psychiatric: Mood and affect are normal. Speech and behavior are normal.  ____________________________________________   LABS (all labs ordered are listed, but only abnormal results are displayed)  Labs Reviewed  GROUP A STREP BY PCR  RESPIRATORY PANEL BY RT PCR (FLU A&B, COVID)   ____________________________________________  EKG  none  ____________________________________________  RADIOLOGY  I have personally reviewed the images performed during this visit and I agree with the Radiologist's read.   Interpretation by Radiologist:  DG Chest 2 View  Result Date: 06/08/2019 CLINICAL DATA:  Cough and fever. Sore throat for 2 days. EXAM: CHEST - 2 VIEW COMPARISON:  None. FINDINGS: Heart size is normal. Mild central airway thickening is present. No focal airspace disease is evident. Lung volumes are low. Visualized soft tissues bony are. IMPRESSION: Central airway thickening is present without focal airspace disease. This is nonspecific, but likely represents an acute viral process or reactive airways disease. Electronically Signed   By: San Morelle M.D.   On: 06/08/2019 05:16     ____________________________________________   PROCEDURES  Procedure(s) performed: None Procedures Critical Care performed:  None ____________________________________________   INITIAL IMPRESSION / ASSESSMENT AND PLAN / ED COURSE  19 y.o. female who Presents for evaluation of sore throat fever and cough.  Patient extremely well-appearing in no distress, with normal exam, normal work of breathing, normal sats,  oropharynx is clear, abdomen soft and nontender.  Covid and flu negative.  Chest x-ray showing viral illness with no evidence of pneumonia.  Strep swab negative.  Patient received Tylenol with resolution of her fever and tachycardia.  Patient remained extremely well-appearing and in no respiratory distress, with no chest pain or shortness of breath.  She was discharged home with supportive care with follow-up with PCP.  Discussed return precautions for chest pain, shortness of breath, or worsening of her symptoms.  Old medical records were reviewed.      _____________________________________________ Please note:  Patient was evaluated in Emergency Department today for the symptoms described in the history of present illness. Patient was evaluated in the context of the global COVID-19 pandemic, which necessitated consideration that the patient might be at risk for infection with the SARS-CoV-2 virus that causes COVID-19. Institutional protocols and algorithms that pertain to the evaluation of patients at risk for COVID-19 are in a state of rapid change based on information released by regulatory bodies including the CDC and federal and state organizations. These policies and algorithms were followed during the patient's care in the ED.  Some ED evaluations and interventions may be  delayed as a result of limited staffing during the pandemic.   [place] Controlled Substance Database was reviewed by me. ____________________________________________   FINAL CLINICAL IMPRESSION(S) / ED DIAGNOSES   Final diagnoses:  Viral URI with cough      NEW MEDICATIONS STARTED DURING THIS VISIT:  ED Discharge Orders    None       Note:  This document was prepared using Dragon voice recognition software and may include unintentional dictation errors.    Don Perking, [place], MD 06/08/19 [phone]

## 2019-07-05 ENCOUNTER — Other Ambulatory Visit: Payer: Self-pay | Admitting: Physician Assistant

## 2019-07-05 DIAGNOSIS — Z3042 Encounter for surveillance of injectable contraceptive: Secondary | ICD-10-CM

## 2019-07-05 MED ORDER — MEDROXYPROGESTERONE ACETATE 150 MG/ML IM SUSP
150.0000 mg | Freq: Once | INTRAMUSCULAR | Status: AC
  Administered 2019-07-06: 150 mg via INTRAMUSCULAR

## 2019-07-05 NOTE — Progress Notes (Signed)
Per chart review, patient seen for PP exam 03/2019 and elected to get Depo at [facility].  If patient comes for appointment on 07/06/2019, may have Depo provided that she desires to continue, is on time and BP is normal.

## 2019-07-06 ENCOUNTER — Ambulatory Visit (LOCAL_COMMUNITY_HEALTH_CENTER): Payer: Medicaid Other

## 2019-07-06 ENCOUNTER — Other Ambulatory Visit: Payer: Self-pay

## 2019-07-06 VITALS — BP 118/79 | Ht 67.0 in | Wt 185.5 lb

## 2019-07-06 DIAGNOSIS — Z30013 Encounter for initial prescription of injectable contraceptive: Secondary | ICD-10-CM

## 2019-07-06 DIAGNOSIS — Z3009 Encounter for other general counseling and advice on contraception: Secondary | ICD-10-CM | POA: Diagnosis not present

## 2019-07-06 NOTE — Progress Notes (Signed)
Folic acid counseling completed and declined MVI. Depo administered without difficulty per 07/05/2019 written order of Sadie Haber PA. Client now desires to receive Depo at [facility] and not [facility] as stated at 03/2019 [facility] post-partum appt. Hazle Coca CNM sent message for Depo orders. Jossie Ng, RN

## 2019-07-08 ENCOUNTER — Other Ambulatory Visit: Payer: Self-pay | Admitting: Advanced Practice Midwife

## 2019-07-08 DIAGNOSIS — Z3042 Encounter for surveillance of injectable contraceptive: Secondary | ICD-10-CM

## 2019-07-08 MED ORDER — MEDROXYPROGESTERONE ACETATE 150 MG/ML IM SUSP
150.0000 mg | INTRAMUSCULAR | Status: AC
  Administered 2019-09-26 – 2020-03-02 (×3): 150 mg via INTRAMUSCULAR

## 2019-07-08 NOTE — Progress Notes (Signed)
Patient desires to receive Depo at [facility].  May have q 11-13 weeks until RP due 03/2020.

## 2019-09-26 ENCOUNTER — Ambulatory Visit (LOCAL_COMMUNITY_HEALTH_CENTER): Payer: Medicaid Other

## 2019-09-26 ENCOUNTER — Other Ambulatory Visit: Payer: Self-pay

## 2019-09-26 VITALS — BP 115/73 | HR 92 | Ht 67.0 in | Wt 180.0 lb

## 2019-09-26 DIAGNOSIS — Z30013 Encounter for initial prescription of injectable contraceptive: Secondary | ICD-10-CM

## 2019-09-26 DIAGNOSIS — Z3009 Encounter for other general counseling and advice on contraception: Secondary | ICD-10-CM

## 2019-09-26 NOTE — Progress Notes (Signed)
Folic acid counseling completed and MVI declined. Depo administered without difficulty per 5/21/201 order written by E. Sciora CNM (order became effective 09/21/2019). Client tolerated injection without complaint. Jossie Ng, RN

## 2019-12-16 ENCOUNTER — Other Ambulatory Visit: Payer: Self-pay

## 2019-12-16 ENCOUNTER — Ambulatory Visit (LOCAL_COMMUNITY_HEALTH_CENTER): Payer: Medicaid Other

## 2019-12-16 VITALS — BP 116/79 | Ht 67.0 in | Wt 175.5 lb

## 2019-12-16 DIAGNOSIS — Z3009 Encounter for other general counseling and advice on contraception: Secondary | ICD-10-CM | POA: Diagnosis not present

## 2019-12-16 DIAGNOSIS — Z30013 Encounter for initial prescription of injectable contraceptive: Secondary | ICD-10-CM | POA: Diagnosis not present

## 2019-12-16 DIAGNOSIS — Z3042 Encounter for surveillance of injectable contraceptive: Secondary | ICD-10-CM

## 2019-12-16 NOTE — Progress Notes (Signed)
Pt is 11.4 weeks post depo today. DMPA 150 mg IM administered today per Arnetha Courser, CNM order dated 07/08/19 (depo order until RP due 03/2020).

## 2020-03-02 ENCOUNTER — Other Ambulatory Visit: Payer: Self-pay

## 2020-03-02 ENCOUNTER — Ambulatory Visit (LOCAL_COMMUNITY_HEALTH_CENTER): Payer: Medicaid Other

## 2020-03-02 VITALS — BP 120/71 | Ht 67.0 in | Wt 177.0 lb

## 2020-03-02 DIAGNOSIS — Z3009 Encounter for other general counseling and advice on contraception: Secondary | ICD-10-CM

## 2020-03-02 DIAGNOSIS — Z3042 Encounter for surveillance of injectable contraceptive: Secondary | ICD-10-CM | POA: Diagnosis not present

## 2020-03-02 NOTE — Progress Notes (Signed)
11 weeks post depo. Voices no concerns. Depo 150mg  IM given today Left Deltoid per order by , CNM dated 07/08/2019. Tolerated well.  Chart review indicates physical due 03/2020. Explained to pt that she may schedule RP when depo due 05/18/2020 (approx). Pt in agreement. 07/18/2020, RN

## 2020-05-24 ENCOUNTER — Ambulatory Visit: Payer: Medicaid Other

## 2020-06-08 ENCOUNTER — Ambulatory Visit (LOCAL_COMMUNITY_HEALTH_CENTER): Payer: Medicaid Other | Admitting: Advanced Practice Midwife

## 2020-06-08 ENCOUNTER — Encounter: Payer: Self-pay | Admitting: Advanced Practice Midwife

## 2020-06-08 ENCOUNTER — Other Ambulatory Visit: Payer: Self-pay

## 2020-06-08 VITALS — BP 120/79 | HR 89 | Ht 67.0 in | Wt 177.2 lb

## 2020-06-08 DIAGNOSIS — Z3042 Encounter for surveillance of injectable contraceptive: Secondary | ICD-10-CM

## 2020-06-08 DIAGNOSIS — Z3009 Encounter for other general counseling and advice on contraception: Secondary | ICD-10-CM | POA: Diagnosis not present

## 2020-06-08 DIAGNOSIS — E663 Overweight: Secondary | ICD-10-CM

## 2020-06-08 LAB — WET PREP FOR TRICH, YEAST, CLUE
Clue Cell Exam: POSITIVE — AB
Trichomonas Exam: NEGATIVE
Yeast Exam: NEGATIVE

## 2020-06-08 MED ORDER — MEDROXYPROGESTERONE ACETATE 150 MG/ML IM SUSP
150.0000 mg | INTRAMUSCULAR | Status: AC
  Administered 2020-06-08 – 2021-03-25 (×4): 150 mg via INTRAMUSCULAR

## 2020-06-08 NOTE — Progress Notes (Signed)
[facility] [address] Main Number: [phone]    Family Planning Visit- Initial Visit  Subjective:  Jessica Love is a 20 y.o. SBF [id number](24 mo old son)   being seen today for an initial annual visit and to discuss contraceptive options.  The patient is currently using Depo Provera for pregnancy prevention. Patient reports she does not want a pregnancy in the next year.  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  Chief Complaint  Patient presents with  . Annual Exam  . Contraception    Depo    Patient reports needs physical, pap, DMPA today. Last DMPA 03/02/20. Last physical at pp exam 04/19/19. LMP 06/04/20. Last sex 05/27/20 with condom; with current partner x 3 years; 1 partner in last 3 mo. Last ETOH 03/2020 (1 mixed drink) q couple months  Patient denies cigs, vaping, cigars, MJ   Body mass index is 27.75 kg/m. - Patient is eligible for diabetes screening based on BMI and age >69?  not applicable HA1C ordered? no  Patient reports 1  partner/s in last year. Desires STI screening?  Yes  Has patient been screened once for HCV in the past?  No  No results found for: HCVAB  Does the patient have current drug use (including MJ), have a partner with drug use, and/or has been incarcerated since last result? No  If yes-- Screen for HCV through [facility]   Does the patient meet criteria for HBV testing? No  Criteria:  -Household, sexual or needle sharing contact with HBV -History of drug use -HIV positive -Those with known Hep C   Health Maintenance Due  Topic Date Due  . Hepatitis C Screening  Never done  . COVID-19 Vaccine (1) Never done  . HPV VACCINES (1 - 2-dose series) Never done  . PAP-Cervical Cytology Screening  Never done  . PAP SMEAR-Modifier  Never done  . CHLAMYDIA SCREENING  01/19/2020     Review of Systems  Constitutional: Positive for weight loss (weight loss and gain since birth of baby 15 mo ago).  Neurological: Positive for headaches (due to pollen per pt; 5x/wk on different places of head relieved with taking allergy meds;-N&V).  All other systems reviewed and are negative.   The following portions of the patient's history were reviewed and updated as appropriate: allergies, current medications, past family history, past medical history, past social history, past surgical history and problem list. Problem list updated.   See flowsheet for other program required questions.  Objective:   Vitals:   06/08/20 0956  BP: 120/79  Pulse: 89  Weight: 177 lb 3.2 oz (80.4 kg)  Height: 5\' 7"  (1.702 m)    Physical Exam Constitutional:      Appearance: Normal appearance. She is normal weight.  HENT:     Head: Normocephalic and atraumatic.     Comments: Thyroid without masses or enlargement; Negative lymphadenopathy    Mouth/Throat:     Mouth: Mucous membranes are moist.     Comments: Last dental exam 1 year ago Eyes:     Conjunctiva/sclera: Conjunctivae normal.  Cardiovascular:     Rate and Rhythm: Normal rate and regular rhythm.  Pulmonary:     Effort: Pulmonary effort is normal.     Breath sounds: Normal breath sounds.  Chest:  Breasts:     Right: Normal.     Left: Normal.  Abdominal:     Palpations: Abdomen is soft.     Comments: Soft without masses or tenderness  Genitourinary:    General: Normal vulva.     Exam position: Lithotomy position.     Vagina: Vaginal discharge (light red menses blood in vagina) present.     Cervix: Normal.     Uterus: Normal.      Adnexa: Right adnexa normal and left adnexa normal.     Rectum: Normal.     Comments: Pap done Musculoskeletal:        General: Normal range of motion.     Cervical back: Normal range of motion and neck supple.  Skin:    General: Skin is warm and dry.  Neurological:     Mental  Status: She is alert.  Psychiatric:        Mood and Affect: Mood normal.       Assessment and Plan:  Jessica Love is a 20 y.o. female presenting to the [facility] Department for an initial annual wellness/contraceptive visit  Contraception counseling: Reviewed all forms of birth control options in the tiered based approach. available including abstinence; over the counter/barrier methods; hormonal contraceptive medication including pill, patch, ring, injection,contraceptive implant, ECP; hormonal and nonhormonal IUDs; permanent sterilization options including vasectomy and the various tubal sterilization modalities. Risks, benefits, and typical effectiveness rates were reviewed.  Questions were answered.  Written information was also given to the patient to review.  Patient desires DMPA, this was prescribed for patient. She will follow up in  11-13 wks for surveillance.  She was told to call with any further questions, or with any concerns about this method of contraception.  Emphasized use of condoms 100% of the time for STI prevention.  Patient was offered ECP. ECP was not accepted by the patient. ECP counseling was not given - see RN documentation  1. Family planning Treat wet mount per standing orders Immunization nurse consult - WET PREP FOR TRICH, YEAST, CLUE - Chlamydia/Gonorrhea [place] [facility] - Pap IG (Image Guided)  2. Encounter for surveillance of injectable contraceptive May have DMPA 150 mg IM q 11-13 wks x 1 year  3. Overweight BMI=27.7      No follow-ups on file.  No future appointments.  Alberteen Spindle, CNM

## 2020-06-08 NOTE — Progress Notes (Signed)
Wet mount reviewed by Hazle Coca CNM and no treatment indicated. Depo administered per E. Sciora CNM and client tolerated without complaint. Jossie Ng, RN

## 2020-06-13 LAB — PAP IG (IMAGE GUIDED): PAP Smear Comment: 0

## 2020-06-26 IMAGING — CR DG CHEST 2V
1 series · 2 of 2 positions shown · non-contrast
Comparison: None.

CLINICAL DATA: Cough and fever. Sore throat for 2 days.

EXAM:
CHEST - 2 VIEW

[Series 1: w chest pa · 0.14mm/px · 2 of 2 slices shown]
[im 1/2]
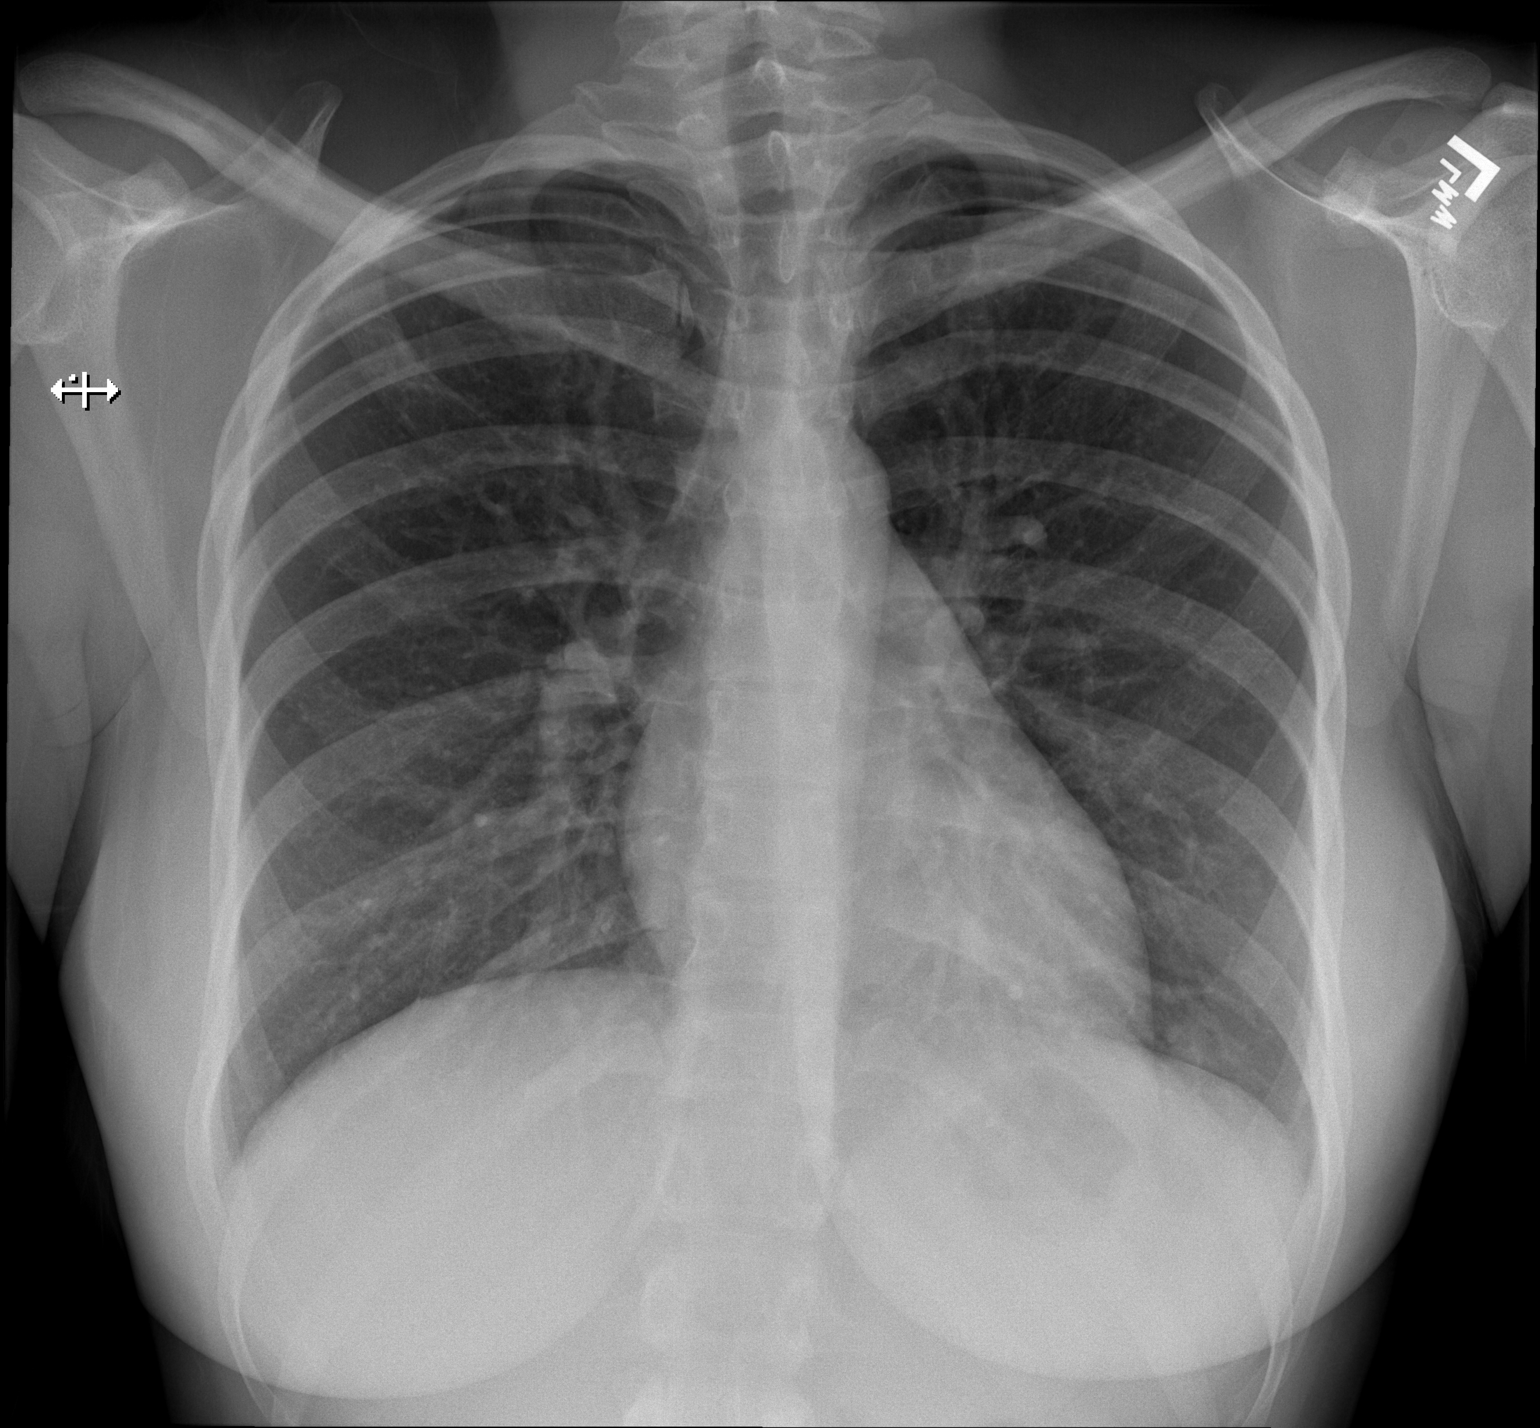
[im 2/2]
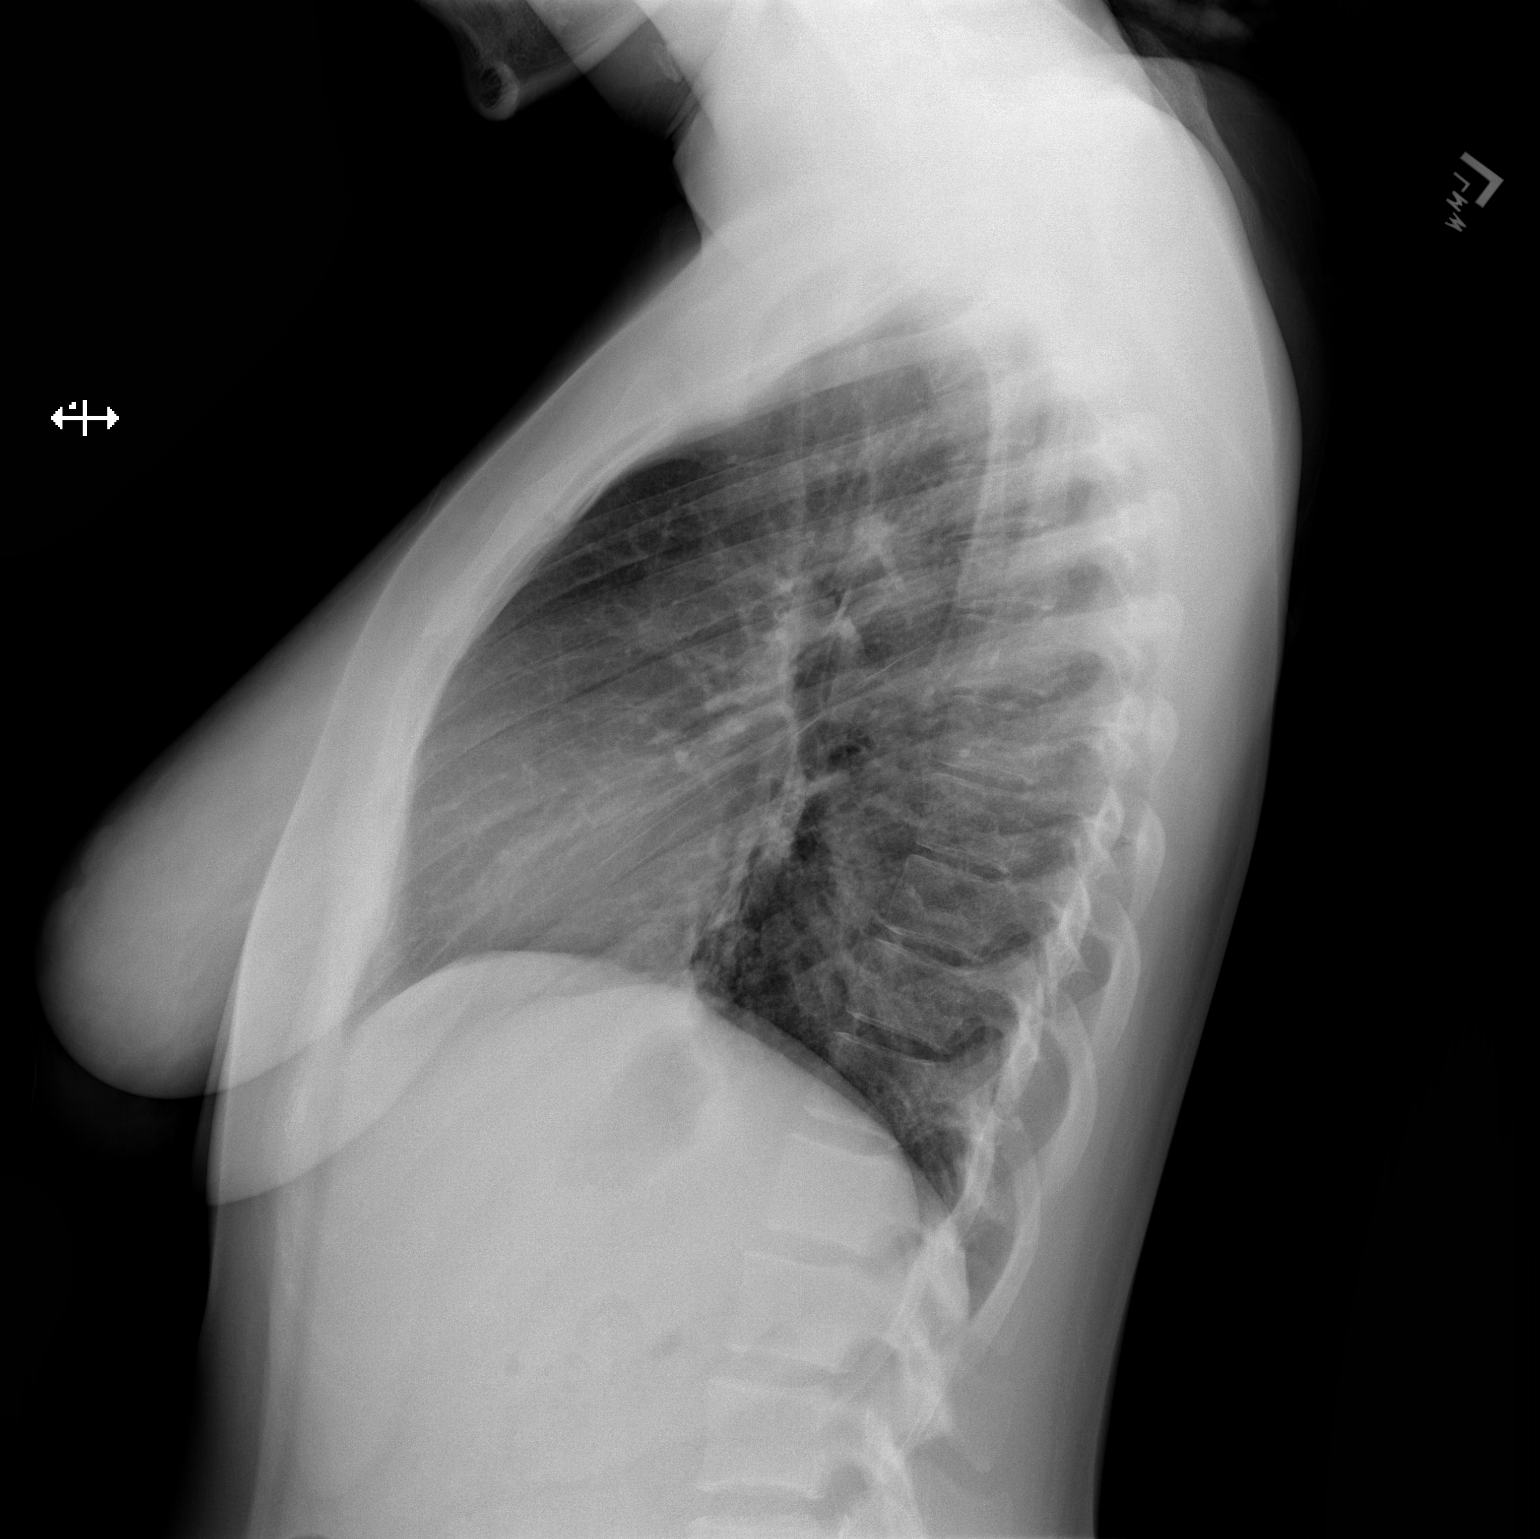

[2 of 2 positions shown; findings below may reference images not displayed]

FINDINGS: Heart size is normal. Mild central airway thickening is present. No
focal airspace disease is evident. Lung volumes are low. Visualized
soft tissues bony are.
IMPRESSION: Central airway thickening is present without focal airspace disease.
This is nonspecific, but likely represents an acute viral process or
reactive airways disease.

## 2020-08-28 ENCOUNTER — Ambulatory Visit: Payer: Medicaid Other

## 2020-09-03 ENCOUNTER — Other Ambulatory Visit: Payer: Self-pay

## 2020-09-03 ENCOUNTER — Ambulatory Visit (LOCAL_COMMUNITY_HEALTH_CENTER): Payer: Medicaid Other

## 2020-09-03 VITALS — BP 113/76 | Ht 67.0 in | Wt 176.5 lb

## 2020-09-03 DIAGNOSIS — Z3009 Encounter for other general counseling and advice on contraception: Secondary | ICD-10-CM | POA: Diagnosis not present

## 2020-09-03 DIAGNOSIS — Z3042 Encounter for surveillance of injectable contraceptive: Secondary | ICD-10-CM

## 2020-09-03 NOTE — Progress Notes (Signed)
12 weeks 3 days post depo. Voices no concerns. Depo given today per order by Hazle Coca, CNM dated 06/08/2020. Tolerated well L delt. Next depo due 11/19/2020, pt has reminder. Jerel Shepherd, RN

## 2020-11-23 ENCOUNTER — Ambulatory Visit: Payer: Medicaid Other

## 2020-12-14 ENCOUNTER — Ambulatory Visit (LOCAL_COMMUNITY_HEALTH_CENTER): Payer: Medicaid Other

## 2020-12-14 ENCOUNTER — Other Ambulatory Visit: Payer: Self-pay

## 2020-12-14 VITALS — BP 119/74 | HR 96 | Ht 67.0 in | Wt 178.0 lb

## 2020-12-14 DIAGNOSIS — Z30013 Encounter for initial prescription of injectable contraceptive: Secondary | ICD-10-CM

## 2020-12-14 DIAGNOSIS — Z3009 Encounter for other general counseling and advice on contraception: Secondary | ICD-10-CM | POA: Diagnosis not present

## 2020-12-14 NOTE — Progress Notes (Signed)
Client tolerated Depo injection without complaint. Ethne Jeon, RN  

## 2021-03-11 ENCOUNTER — Ambulatory Visit: Payer: Medicaid Other

## 2021-03-25 ENCOUNTER — Ambulatory Visit (LOCAL_COMMUNITY_HEALTH_CENTER): Payer: Medicaid Other

## 2021-03-25 ENCOUNTER — Other Ambulatory Visit: Payer: Self-pay

## 2021-03-25 VITALS — BP 115/70 | Ht 67.0 in | Wt 174.0 lb

## 2021-03-25 DIAGNOSIS — Z3009 Encounter for other general counseling and advice on contraception: Secondary | ICD-10-CM

## 2021-03-25 DIAGNOSIS — Z3042 Encounter for surveillance of injectable contraceptive: Secondary | ICD-10-CM | POA: Diagnosis not present

## 2021-03-25 NOTE — Progress Notes (Signed)
14 weeks 3 days post depo. Voices no concerns. RN counseled pt to adhere to 11 -13 weeks intervals between depo injections for optimal benefit. Pt reports understanding. Depo given per order by Ola Spurr, CNM dated 06/08/2020. Tolerated well L delt. RP due at next depo, approx 06/10/2020. Has reminder. Josie Saunders, RN

## 2021-06-13 ENCOUNTER — Ambulatory Visit: Payer: Medicaid Other

## 2021-06-19 ENCOUNTER — Encounter: Payer: Self-pay | Admitting: Family Medicine

## 2021-06-19 ENCOUNTER — Ambulatory Visit (LOCAL_COMMUNITY_HEALTH_CENTER): Payer: Medicaid Other | Admitting: Family Medicine

## 2021-06-19 DIAGNOSIS — Z309 Encounter for contraceptive management, unspecified: Secondary | ICD-10-CM | POA: Diagnosis not present

## 2021-06-19 DIAGNOSIS — Z3042 Encounter for surveillance of injectable contraceptive: Secondary | ICD-10-CM

## 2021-06-19 DIAGNOSIS — Z0000 Encounter for general adult medical examination without abnormal findings: Secondary | ICD-10-CM | POA: Diagnosis not present

## 2021-06-19 MED ORDER — MEDROXYPROGESTERONE ACETATE 150 MG/ML IM SUSP
150.0000 mg | INTRAMUSCULAR | Status: AC
  Administered 2021-06-19: 150 mg via INTRAMUSCULAR

## 2021-06-19 NOTE — Progress Notes (Signed)
Patient here for annual exam and Depo. Last PE 06/08/2020, last pap same day, NILM. Last Depo 03/25/2021, 12 2/7 since last Depo.Burt Knack, RN  ?

## 2021-06-19 NOTE — Progress Notes (Signed)
Depo consent signed, Depo given right deltoid, tolerated well and next Depo card given.Burt Knack, RN  ?

## 2021-06-19 NOTE — Progress Notes (Signed)
[place] [facility] ?Main Number: [phone] ? ?Family Planning Visit- Repeat Yearly Visit ? ?Subjective:  ?Jessica Love is a 21 y.o. G1P1001  being seen today for an annual wellness visit and to discuss contraception options.   The patient is currently using Hormonal Injection for pregnancy prevention. Patient does not want a pregnancy in the next year.  ? ? report they are looking for a method that provides High efficacy at preventing pregnancy, Minimal bleeding/improved bleeding profile, Discrete method, Ready when they are , and Methods that does not involve too much memory ? ? 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 ? ?Chief Complaint  ?Patient presents with  ? Annual Exam  ? ? ?Patient reports here for physical and continue depo ? ?Patient denies any concerns that are not listed on intake form , see form.    ? ?See flowsheet for other program required questions.  ? ?There is no height or weight on file to calculate BMI. - Patient is eligible for diabetes screening based on BMI and age [id number]?  not applicable ?HA1C ordered? not applicable ? ?Patient reports 1 of partners in last year. Desires STI screening?  No - declined  ? ? ?Has patient been screened once for HCV in the past?  No ? No results found for: HCVAB ? ?Does the patient have current of drug use, have a partner with drug use, and/or has been incarcerated since last result? No  ?If yes-- Screen for HCV through [place] ?  ?Does the patient meet criteria for HBV testing? No ? ?Criteria:  ?-Household, sexual or needle sharing contact with HBV ?-History of drug use ?-HIV positive ?-Those with known Hep C ? ? ?Health Maintenance Due  ?Topic Date Due  ? COVID-19 Vaccine (1) Never done  ? Hepatitis C Screening  Never done  ? CHLAMYDIA SCREENING  01/19/2020  ? ? ?Review of Systems   ?Constitutional:  Negative for chills, fever, malaise/fatigue and weight loss.  ?HENT:  Negative for congestion, hearing loss and sore throat.   ?Eyes:  Negative for blurred vision, double vision and photophobia.  ?Respiratory:  Negative for shortness of breath.   ?Cardiovascular:  Negative for chest pain.  ?Gastrointestinal:  Negative for abdominal pain, blood in stool, constipation, diarrhea, heartburn, nausea and vomiting.  ?Genitourinary:  Negative for dysuria and frequency.  ?Musculoskeletal:  Negative for back pain, joint pain and neck pain.  ?Skin:  Negative for itching and rash.  ?Neurological:  Positive for headaches. Negative for dizziness and weakness.  ?     Last 1 weeks ago, does not take medications  for migraines  ?Endo/Heme/Allergies:  Does not bruise/bleed easily.  ?Psychiatric/Behavioral:  Negative for depression, substance abuse and suicidal ideas.   ? ?The following portions of the patient's history were reviewed and updated as appropriate: allergies, current medications, past family history, past medical history, past social history, past surgical history and problem list. Problem list updated. ? ?Objective:  ?There were no vitals filed for this visit. ? ?Physical Exam ?Vitals and nursing note reviewed.  ?Constitutional:   ?   Appearance: Normal appearance.  ?HENT:  ?   Head: Normocephalic and atraumatic.  ?   Mouth/Throat:  ?   Mouth: Mucous membranes are moist.  ?   Dentition: Normal dentition. No dental caries.  ?   Pharynx: No oropharyngeal exudate or posterior oropharyngeal  erythema.  ?Eyes:  ?   General: No scleral icterus. ?Neck:  ?   Thyroid: No thyroid mass, thyromegaly or thyroid tenderness.  ?Cardiovascular:  ?   Rate and Rhythm: Normal rate and regular rhythm.  ?   Pulses: Normal pulses.  ?   Heart sounds: Normal heart sounds.  ?Pulmonary:  ?   Effort: Pulmonary effort is normal.  ?   Breath sounds: Normal breath sounds.  ?Chest:  ?Breasts: ?   Tanner Score is 5.  ?   Breasts are  symmetrical.  ?   Right: Normal.  ?   Left: Normal.  ?Abdominal:  ?   General: Abdomen is flat. Bowel sounds are normal.  ?   Palpations: Abdomen is soft.  ?Genitourinary: ?   Comments: deferred ?Musculoskeletal:     ?   General: Normal range of motion.  ?   Cervical back: Normal range of motion and neck supple.  ?Skin: ?   General: Skin is warm and dry.  ?Neurological:  ?   General: No focal deficit present.  ?   Mental Status: She is alert and oriented to person, place, and time.  ?Psychiatric:     ?   Mood and Affect: Mood normal.     ?   Behavior: Behavior normal.  ? ? ? ? ?Assessment and Plan:  ?Jessica Love is a 21 y.o. female [place] presenting to the [facility] Department for an yearly wellness and contraception visit ? ? ?Contraception counseling: Reviewed options based on patient desire and reproductive life plan. Patient is interested in Hormonal Injection. This was provided to the patient today.  ? ?Risks, benefits, and typical effectiveness rates were reviewed.  Questions were answered.  Written information was also given to the patient to review.   ? ?The patient will follow up in  11-13  weeks for surveillance.  The patient was told to call with any further questions, or with any concerns about this method of contraception.  Emphasized use of condoms 100% of the time for STI prevention. ? ?Patient was assessed for need for ECP. Patient was offered ECP based on > 120 hours .  Patient is within 7 days of unprotected sex. Patient was offered ECP. Reviewed options and patient desired No method of ECP, declined all   ? ? ?1. Routine general medical examination at a health care facility ?Well woman exam  ?Pap not done today, pap due 2025  ?CBE due 2025 ? ?2. Encounter for surveillance of injectable contraceptive ?Pt wants to continue with DMPA  ?[place] for DMPA 150 mg IM every 11-13 weeks x 1 year.  ?See RN documentation  ?- medroxyPROGESTERone (DEPO-PROVERA) injection 150 mg ? ? ? ? ?Return in  about 11 weeks (around 09/04/2021) for Depo. ? ?No future appointments. ? ?Junious Dresser, FNP ? ?

## 2021-12-23 ENCOUNTER — Ambulatory Visit (LOCAL_COMMUNITY_HEALTH_CENTER): Payer: Medicaid Other

## 2021-12-23 VITALS — BP 127/72 | Ht 66.0 in | Wt 183.5 lb

## 2021-12-23 DIAGNOSIS — Z3201 Encounter for pregnancy test, result positive: Secondary | ICD-10-CM | POA: Diagnosis not present

## 2021-12-23 LAB — PREGNANCY, URINE: Preg Test, Ur: POSITIVE — AB

## 2021-12-23 MED ORDER — PRENATAL 27-0.8 MG PO TABS: 1.0000 | ORAL_TABLET | Freq: Every day | ORAL | 0 refills | Status: AC

## 2021-12-23 NOTE — Progress Notes (Signed)
UPT positive. Plans prenatal care at [facility]. Sent to DSS for medicaid/preg women.  The patient was dispensed prenatal vitamins #100 today. I provided counseling today regarding the medication. We discussed the medication, the side effects and when to call clinic. Patient given the opportunity to ask questions. Questions answered.    Josie Saunders, RN

## 2023-07-10 ENCOUNTER — Ambulatory Visit (LOCAL_COMMUNITY_HEALTH_CENTER)

## 2023-07-10 VITALS — BP 121/61 | Ht 67.0 in | Wt 199.5 lb

## 2023-07-10 DIAGNOSIS — Z309 Encounter for contraceptive management, unspecified: Secondary | ICD-10-CM

## 2023-07-10 DIAGNOSIS — Z3201 Encounter for pregnancy test, result positive: Secondary | ICD-10-CM

## 2023-07-10 LAB — PREGNANCY, URINE: Preg Test, Ur: POSITIVE — AB

## 2023-07-10 MED ORDER — PRENATAL 27-0.8 MG PO TABS: 1.0000 | ORAL_TABLET | Freq: Every day | ORAL | Status: AC

## 2023-07-10 NOTE — Progress Notes (Signed)
 UPT positive. Unsure of where she would like to receive prenatal care; advised to establish care ASAP. List of local providers given to patient.  The patient was dispensed prenatal vitamins #100 today. I provided counseling today regarding the medication. We discussed the medication, the side effects and when to call clinic.   Positive pregnancy packet reviewed and given to patient. Also counseled on hydration and when to seek medical attention.   Patient given the opportunity to ask questions. Questions answered.   Clare Critchley, RN

## 2023-08-31 LAB — PANORAMA PRENATAL TEST FULL PANEL:PANORAMA TEST PLUS 5 ADDITIONAL MICRODELETIONS: FETAL FRACTION: 5.3
